# Patient Record
Sex: Male | Born: 2000 | Race: Black or African American | Hispanic: No | Marital: Single | Smoking: Never smoker
Health system: Southern US, Community
[De-identification: ages and names within clinical notes are randomized; demographics above are authoritative.]

## PROBLEM LIST (undated history)

## (undated) DIAGNOSIS — F802 Mixed receptive-expressive language disorder: Secondary | ICD-10-CM

## (undated) DIAGNOSIS — F909 Attention-deficit hyperactivity disorder, unspecified type: Secondary | ICD-10-CM

## (undated) DIAGNOSIS — Q231 Congenital insufficiency of aortic valve: Secondary | ICD-10-CM

## (undated) DIAGNOSIS — F819 Developmental disorder of scholastic skills, unspecified: Secondary | ICD-10-CM

## (undated) HISTORY — DX: Congenital insufficiency of aortic valve: Q23.1

## (undated) HISTORY — DX: Mixed receptive-expressive language disorder: F80.2

## (undated) HISTORY — DX: Developmental disorder of scholastic skills, unspecified: F81.9

---
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
## 2022-05-02 ENCOUNTER — Ambulatory Visit
Admission: EM | Admit: 2022-05-02 | Discharge: 2022-05-02 | Disposition: A | Discharge status: Home / Self Care | Payer: Medicaid Other | Attending: Physician Assistant | Admitting: Physician Assistant

## 2022-05-02 DIAGNOSIS — R109 Unspecified abdominal pain: Secondary | ICD-10-CM | POA: Diagnosis not present

## 2022-05-02 DIAGNOSIS — R1011 Right upper quadrant pain: Secondary | ICD-10-CM

## 2022-05-02 DIAGNOSIS — R319 Hematuria, unspecified: Secondary | ICD-10-CM | POA: Diagnosis not present

## 2022-05-02 DIAGNOSIS — R300 Dysuria: Secondary | ICD-10-CM | POA: Diagnosis not present

## 2022-05-02 LAB — POCT URINALYSIS DIP (MANUAL ENTRY)
Bilirubin, UA: NEGATIVE
Blood, UA: NEGATIVE
Glucose, UA: NEGATIVE mg/dL
Ketones, POC UA: NEGATIVE mg/dL
Leukocytes, UA: NEGATIVE
Nitrite, UA: NEGATIVE
Protein Ur, POC: NEGATIVE mg/dL
Spec Grav, UA: >=1.03 — AB (ref 1.010–1.025)
Urobilinogen, UA: 0.2 E.U./dL
pH, UA: 7 (ref 5.0–8.0)

## 2022-05-02 NOTE — ED Provider Notes (Signed)
[name]    CSN: [id number] Arrival date & time: 05/02/22  1217      History   Chief Complaint Chief Complaint  Patient presents with   Dysuria   Flank Pain    HPI Steve Holt is a 22 y.o. male.   72-year-old male presents with abdominal pain.  Patient indicates for the past 3 days he has been having abdominal pain which is mainly been located in the right upper quadrant and right flank.  Patient indicates that it is intermittent but persistent.  Patient indicates he has not traumatized or been hit in the back.  He indicates also that he noticed having dark yellow urine on a repeated basis along with intermittent dysuria.  He indicates that he ran into a pole 2 days ago hurting the left testicle and then afterwards he noticed that he had some blood in the urine.  Patient indicates he is not having any fever, chills, nausea or vomiting.  Patient indicates he has not injured his back, but he does do lifting at his job.  He indicates making considerable amount of soft drinks throughout the day.   Dysuria Presenting symptoms: dysuria   Associated symptoms: abdominal pain (Right upper quadrant) and flank pain   Flank Pain Associated symptoms include abdominal pain (Right upper quadrant).    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 No pertinent  surgical history.     Home Medications    Prior to Admission medications   Medication Sig Start Date End Date Taking? Authorizing Provider  cetirizine (ZYRTEC) 10 MG tablet Take 1 tablet (10 mg total) by mouth daily. 11/24/19   Roselind Messier, MD  lisdexamfetamine (VYVANSE) 60 MG capsule Take 1 capsule (60 mg total) by mouth every morning. Patient not taking: Reported on 06/09/2019 06/30/18   Gwynne Edinger, MD  PROAIR HFA 108 [phone] Base) MCG/ACT inhaler TAKE 2 PUFFS BY MOUTH EVERY 6 HOURS AS NEEDED FOR WHEEZE OR SHORTNESS OF BREATH 11/24/19   Roselind Messier, MD  Vitamin D, Ergocalciferol, (DRISDOL) 1.25 MG (50000 UNIT) CAPS capsule Take 1 capsule (50,000 Units total) by mouth every 7 (seven) days. 11/24/19   Roselind Messier, MD    Family History Family History  Problem Relation Age of Onset   Asthma Mother    Asthma Brother    Coronary artery disease Paternal Uncle    Heart attack Paternal Uncle        2 paternal uncles had MI in early 30s   Diabetes Father     Social History Social History   Tobacco Use   Smoking status: Never   Smokeless tobacco: Never  Vaping Use   Vaping Use: Every day   Substances: Nicotine, Flavoring  Substance Use Topics   Alcohol use: Never   Drug use: Yes    Types: Marijuana    Comment: occ     Allergies   Patient has no known allergies.  Review of Systems Review of Systems  Gastrointestinal:  Positive for abdominal pain (Right upper quadrant).  Genitourinary:  Positive for dysuria and flank pain.     Physical Exam Triage Vital Signs ED Triage Vitals  Enc Vitals Group     BP 05/02/22 1341 (!) 144/80     Pulse Rate 05/02/22 1341 (!) 50     Resp 05/02/22 1341 16     Temp 05/02/22 1341 98.2 F (36.8 C)     Temp src --      SpO2 05/02/22 1341 98 %     Weight --      Height --      Head Circumference --      Peak Flow --      Pain Score 05/02/22 1340 6     Pain Loc --      Pain Edu? --      Excl. in [place]? --    No data  found.  Updated Vital Signs BP (!) 144/80   Pulse (!) 50   Temp 98.2 F (36.8 C)   Resp 16   SpO2 98%   Visual Acuity Right Eye Distance:   Left Eye Distance:   Bilateral Distance:    Right Eye Near:   Left Eye Near:    Bilateral Near:     Physical Exam Constitutional:      Appearance: Normal appearance.  Cardiovascular:     Rate and Rhythm: Normal rate and regular rhythm.     Heart sounds: Normal heart sounds.  Abdominal:     General: Abdomen is flat. Bowel sounds are normal.     Palpations: Abdomen is soft.     Tenderness: There is abdominal tenderness in the right upper quadrant. There is right CVA tenderness. There is no guarding or rebound.  Neurological:     Mental Status: He is alert.      UC Treatments / Results  Labs (all labs ordered are listed, but only abnormal results are displayed) Labs Reviewed  POCT URINALYSIS DIP (MANUAL ENTRY) - Abnormal; Notable for the following components:      Result Value   Spec Grav, UA >=1.030 (*)    All other components within normal limits  CBC  COMPREHENSIVE METABOLIC PANEL  TSH    EKG   Radiology No results found.  Procedures Procedures (including critical care time)  Medications Ordered in UC Medications - No data to display  Initial Impression / Assessment and Plan / UC Course  I have reviewed the triage vital signs and the nursing notes.  Pertinent labs & imaging results that were available during my care of the patient were reviewed by me and considered in my medical decision making (see chart for details).    Plan: The diagnosis will be treated with the following: 1.  Acute right flank pain: A.  CBC, CMP, TSH lab results are pending. 2.  Dysuria: A.  Advised to stop drinking soft drinks and more water based fluids. 3.  Hematuria: A.  Advised to stop drinking soft drinks and drink more water based fluids. 4.  Abdominal pain right upper quadrant: A.  CBC, CMP, TSH lab results are pending. 5.   Advised follow-up PCP return to urgent care as needed. Final Clinical Impressions(s) / UC Diagnoses   Final diagnoses:  Acute right flank pain  Dysuria  Hematuria, unspecified type  Abdominal pain, right upper quadrant     Discharge Instructions      Advised to avoid soft drinks  and drink more water products.  Labs will be completed in 48 hours, if you do not get a call from this office that indicates labs are negative.  Log onto MyChart to be the test results when they post in 48 hours.  Advised to follow-up PCP return to urgent care as needed.    ED Prescriptions   None    PDMP not reviewed this encounter.   Nyoka Lint, PA-C 05/02/22 1414

## 2022-05-02 NOTE — Discharge Instructions (Signed)
Advised to avoid soft drinks and drink more water products.  Labs will be completed in 48 hours, if you do not get a call from this office that indicates labs are negative.  Log onto MyChart to be the test results when they post in 48 hours.  Advised to follow-up PCP return to urgent care as needed.

## 2022-05-02 NOTE — ED Triage Notes (Signed)
Pt presents to uc with co of dysuria and right sided flank pain that radiates to the rlq. Pt reports pain is 9/10 and comes and goes. Pt reports symptoms for 3 days. Pt reports hematuria and dark urine 2 days ago but it has since gone back to normal. Pt reports no otc medications

## 2022-05-03 LAB — CBC
Hematocrit: 44.5 % (ref 37.5–51.0)
Hemoglobin: 13.8 g/dL (ref 13.0–17.7)
MCH: 24.3 pg — ABNORMAL LOW (ref 26.6–33.0)
MCHC: 31 g/dL — ABNORMAL LOW (ref 31.5–35.7)
MCV: 79 fL (ref 79–97)
Platelets: 295 10*3/uL (ref 150–450)
RBC: 5.67 x10E6/uL (ref 4.14–5.80)
RDW: 13.1 % (ref 11.6–15.4)
WBC: 6.7 10*3/uL (ref 3.4–10.8)

## 2022-05-03 LAB — COMPREHENSIVE METABOLIC PANEL
ALT: 19 IU/L (ref 0–44)
AST: 23 IU/L (ref 0–40)
Albumin/Globulin Ratio: 1.5 (ref 1.2–2.2)
Albumin: 4.7 g/dL (ref 4.3–5.2)
Alkaline Phosphatase: 66 IU/L (ref 51–125)
BUN/Creatinine Ratio: 11 (ref 9–20)
BUN: 10 mg/dL (ref 6–20)
Bilirubin Total: 0.2 mg/dL (ref 0.0–1.2)
CO2: 24 mmol/L (ref 20–29)
Calcium: 10.2 mg/dL (ref 8.7–10.2)
Chloride: 101 mmol/L (ref 96–106)
Creatinine, Ser: 0.91 mg/dL (ref 0.76–1.27)
Globulin, Total: 3.1 g/dL (ref 1.5–4.5)
Glucose: 92 mg/dL (ref 70–99)
Potassium: 4.8 mmol/L (ref 3.5–5.2)
Sodium: 140 mmol/L (ref 134–144)
Total Protein: 7.8 g/dL (ref 6.0–8.5)
eGFR: 125 mL/min/{1.73_m2} (ref >59)

## 2022-05-03 LAB — TSH: TSH: 2.29 u[IU]/mL (ref 0.450–4.500)

## 2022-08-10 ENCOUNTER — Ambulatory Visit
Admission: EM | Admit: 2022-08-10 | Discharge: 2022-08-10 | Disposition: A | Discharge status: Home / Self Care | Payer: Medicaid Other | Attending: Internal Medicine | Admitting: Internal Medicine

## 2022-08-10 DIAGNOSIS — J029 Acute pharyngitis, unspecified: Secondary | ICD-10-CM | POA: Diagnosis present

## 2022-08-10 LAB — POCT MONO SCREEN (KUC): Mono, POC: NEGATIVE

## 2022-08-10 LAB — POCT RAPID STREP A (OFFICE): Rapid Strep A Screen: NEGATIVE

## 2022-08-10 MED ORDER — CHLORASEPTIC 1.4 % MT LIQD: 1.0000 | OROMUCOSAL | 0 refills | Status: AC | PRN

## 2022-08-10 NOTE — ED Triage Notes (Signed)
Pt states that he has a sore throat x1 day

## 2022-08-10 NOTE — Discharge Instructions (Signed)
Rapid strep and rapid mono were negative.  I have prescribed you a throat spray to help alleviate discomfort.  Follow-up if any symptoms persist or worsen.

## 2022-08-10 NOTE — ED Provider Notes (Signed)
[name]    CSN: [id number] Arrival date & time: 08/10/22  1018      History   Chief Complaint Chief Complaint  Patient presents with   Sore Throat    X1 day Sore throat    HPI Steve Holt is a 22 y.o. male.   Patient presents with sore throat that started this morning.  Denies any other symptoms or fever.  Denies any known sick contacts.  Patient has not taken any medication for pain.   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 No pertinent surgical history.     Home Medications    Prior to Admission medications   Medication Sig Start Date End Date Taking? Authorizing Provider  phenol (CHLORASEPTIC) 1.4 % LIQD Use as directed 1 spray in the mouth or throat as needed for throat irritation / pain. 08/10/22  Yes Diaz Crago, Acie Fredrickson, FNP  cetirizine (ZYRTEC) 10 MG tablet Take 1 tablet (10 mg total) by mouth daily. 11/24/19   Theadore Nan, MD  lisdexamfetamine (VYVANSE) 60 MG capsule Take 1 capsule (60 mg total) by mouth every morning. Patient not taking: Reported on 06/09/2019 06/30/18   Leatha Gilding, MD  PROAIR HFA 108 [phone] Base) MCG/ACT inhaler TAKE 2 PUFFS BY MOUTH EVERY 6 HOURS AS NEEDED FOR WHEEZE OR SHORTNESS OF BREATH 11/24/19   Theadore Nan, MD  Vitamin D, Ergocalciferol, (DRISDOL) 1.25 MG (50000 UNIT)  CAPS capsule Take 1 capsule (50,000 Units total) by mouth every 7 (seven) days. 11/24/19   Theadore Nan, MD    Family History Family History  Problem Relation Age of Onset   Asthma Mother    Asthma Brother    Coronary artery disease Paternal Uncle    Heart attack Paternal Uncle        2 paternal uncles had MI in early 21s   Diabetes Father     Social History Social History   Tobacco Use   Smoking status: Never   Smokeless tobacco: Never  Vaping Use   Vaping Use: Never used  Substance Use Topics   Alcohol use: Never   Drug use: Yes    Types: Marijuana    Comment: occ     Allergies   Patient has no known allergies.   Review of Systems Review of Systems Per HPI  Physical Exam Triage Vital Signs ED Triage Vitals [08/10/22 1051]  Enc Vitals Group     BP 117/74     Pulse Rate (!) 57     Resp (!) 107     Temp 98.5 F (36.9 C)     Temp Source Oral     SpO2 99 %     Weight 250 lb (113.4 kg)     Height 6\' 4"  (1.93 m)     Head Circumference      Peak Flow      Pain Score 8  Pain Loc      Pain Edu?      Excl. in [place]?    No data found.  Updated Vital Signs BP 117/74 (BP Location: Left Arm)   Pulse (!) 57   Temp 98.5 F (36.9 C) (Oral)   Resp 17   Ht 6\' 4"  (1.93 m)   Wt 250 lb (113.4 kg)   SpO2 99%   BMI 30.43 kg/m   Visual Acuity Right Eye Distance:   Left Eye Distance:   Bilateral Distance:    Right Eye Near:   Left Eye Near:    Bilateral Near:     Physical Exam Constitutional:      General: He is not in acute distress.    Appearance: Normal appearance. He is not toxic-appearing or diaphoretic.  HENT:     Head: Normocephalic and atraumatic.     Right Ear: Tympanic membrane and ear canal normal.     Left Ear: Tympanic membrane and ear canal normal.     Nose: No congestion.     Mouth/Throat:     Mouth: Mucous membranes are moist.     Pharynx: Posterior oropharyngeal erythema present.  Eyes:     Extraocular Movements: Extraocular  movements intact.     Conjunctiva/sclera: Conjunctivae normal.     Pupils: Pupils are equal, round, and reactive to light.  Cardiovascular:     Rate and Rhythm: Normal rate and regular rhythm.     Pulses: Normal pulses.     Heart sounds: Normal heart sounds.  Pulmonary:     Effort: Pulmonary effort is normal. No respiratory distress.     Breath sounds: Normal breath sounds. No stridor. No wheezing, rhonchi or rales.  Abdominal:     General: Abdomen is flat. Bowel sounds are normal.     Palpations: Abdomen is soft.  Musculoskeletal:        General: Normal range of motion.     Cervical back: Normal range of motion.  Skin:    General: Skin is warm and dry.  Neurological:     General: No focal deficit present.     Mental Status: He is alert and oriented to person, place, and time. Mental status is at baseline.  Psychiatric:        Mood and Affect: Mood normal.        Behavior: Behavior normal.      UC Treatments / Results  Labs (all labs ordered are listed, but only abnormal results are displayed) Labs Reviewed  CULTURE, GROUP A STREP [facility])  POCT RAPID STREP A (OFFICE)  POCT MONO SCREEN ([facility])    EKG   Radiology No results found.  Procedures Procedures (including critical care time)  Medications Ordered in UC Medications - No data to display  Initial Impression / Assessment and Plan / UC Course  I have reviewed the triage vital signs and the nursing notes.  Pertinent labs & imaging results that were available during my care of the patient were reviewed by me and considered in my medical decision making (see chart for details).     Differential diagnoses include viral illness versus allergy related symptoms.  Rapid strep and mono mono were negative.  Throat culture pending.  Patient declined COVID testing.  Chloraseptic spray prescribed to help alleviate symptoms.  Advised supportive care and management.  Advised strict follow-up precautions.  Patient verbalized  understanding and was agreeable with plan. Final Clinical Impressions(s) / UC Diagnoses   Final diagnoses:  Acute pharyngitis, unspecified etiology  Discharge Instructions      Rapid strep and rapid mono were negative.  I have prescribed you a throat spray to help alleviate discomfort.  Follow-up if any symptoms persist or worsen.     ED Prescriptions     Medication Sig Dispense Auth. Provider   phenol (CHLORASEPTIC) 1.4 % LIQD Use as directed 1 spray in the mouth or throat as needed for throat irritation / pain. 118 mL Gustavus Bryant, [place]      PDMP not reviewed this encounter.   Gustavus Bryant, [place] 08/10/22 1134

## 2022-08-11 LAB — CULTURE, GROUP A STREP (THRC)

## 2023-01-18 IMAGING — DX DG ANKLE COMPLETE 3+V*L*
3 series · 3 of 3 positions shown · non-contrast
Comparison: None.

CLINICAL DATA: Injury, fall.

EXAM:
LEFT ANKLE COMPLETE - 3+ VIEW

[ankle ap]
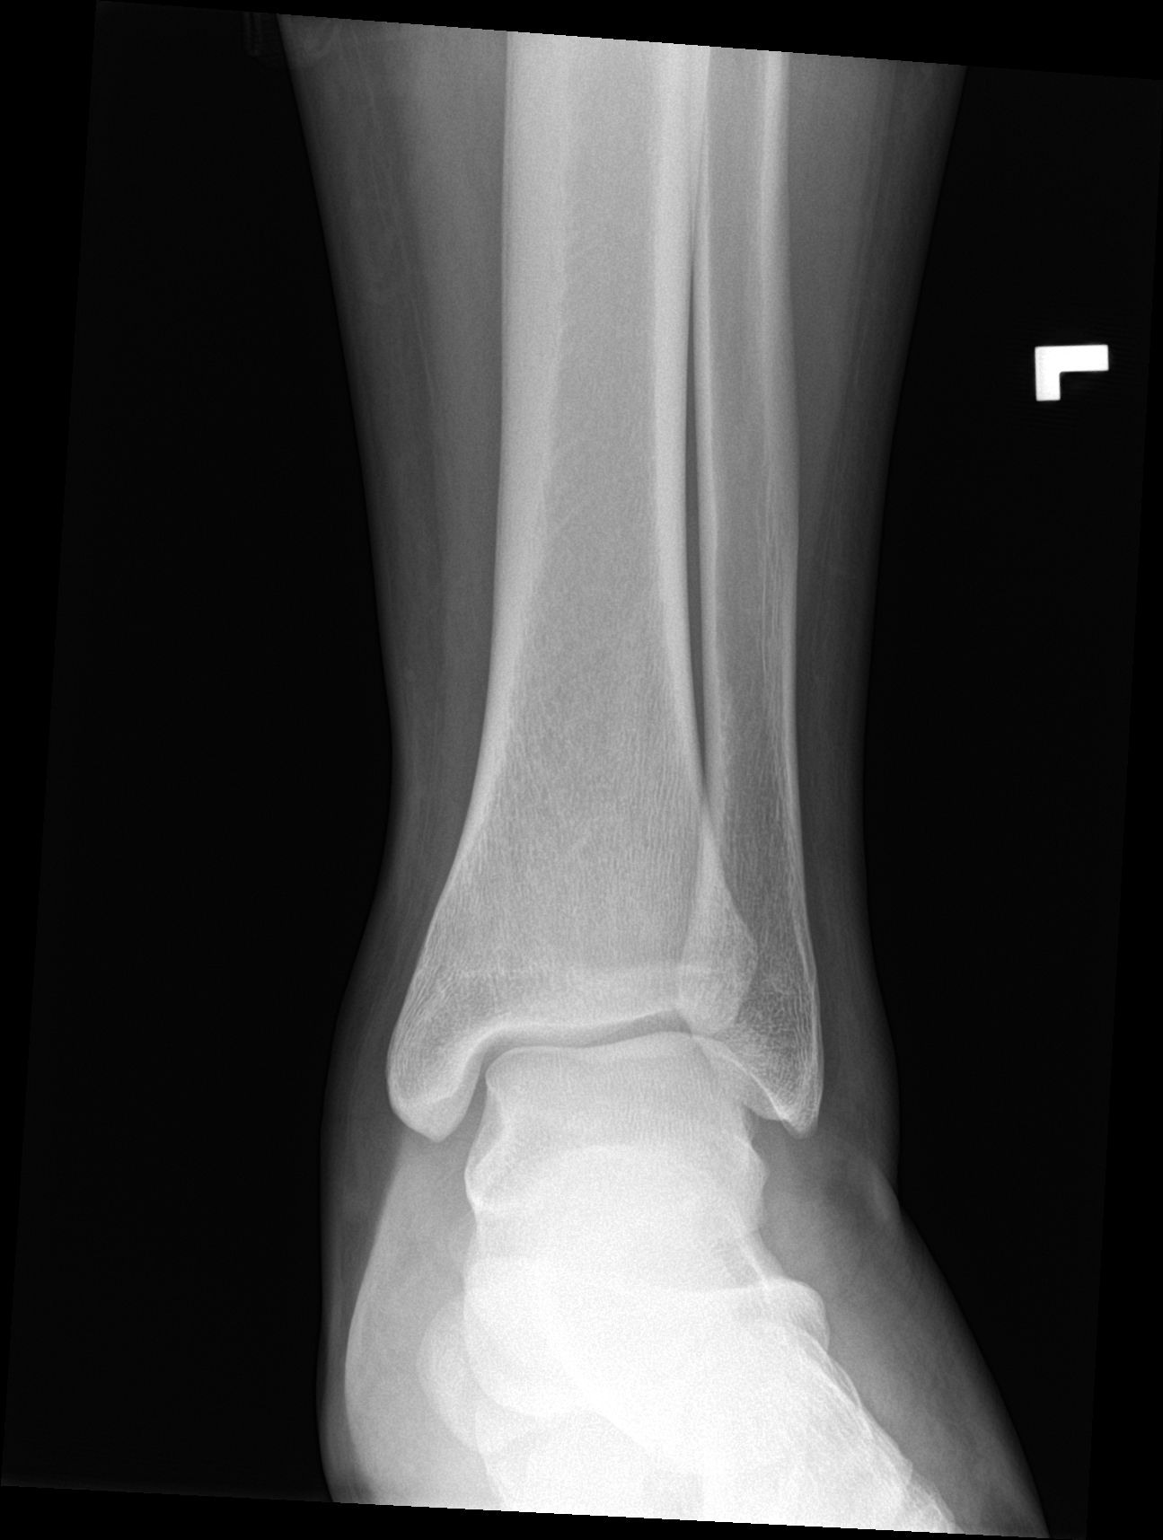

[ankle obl]
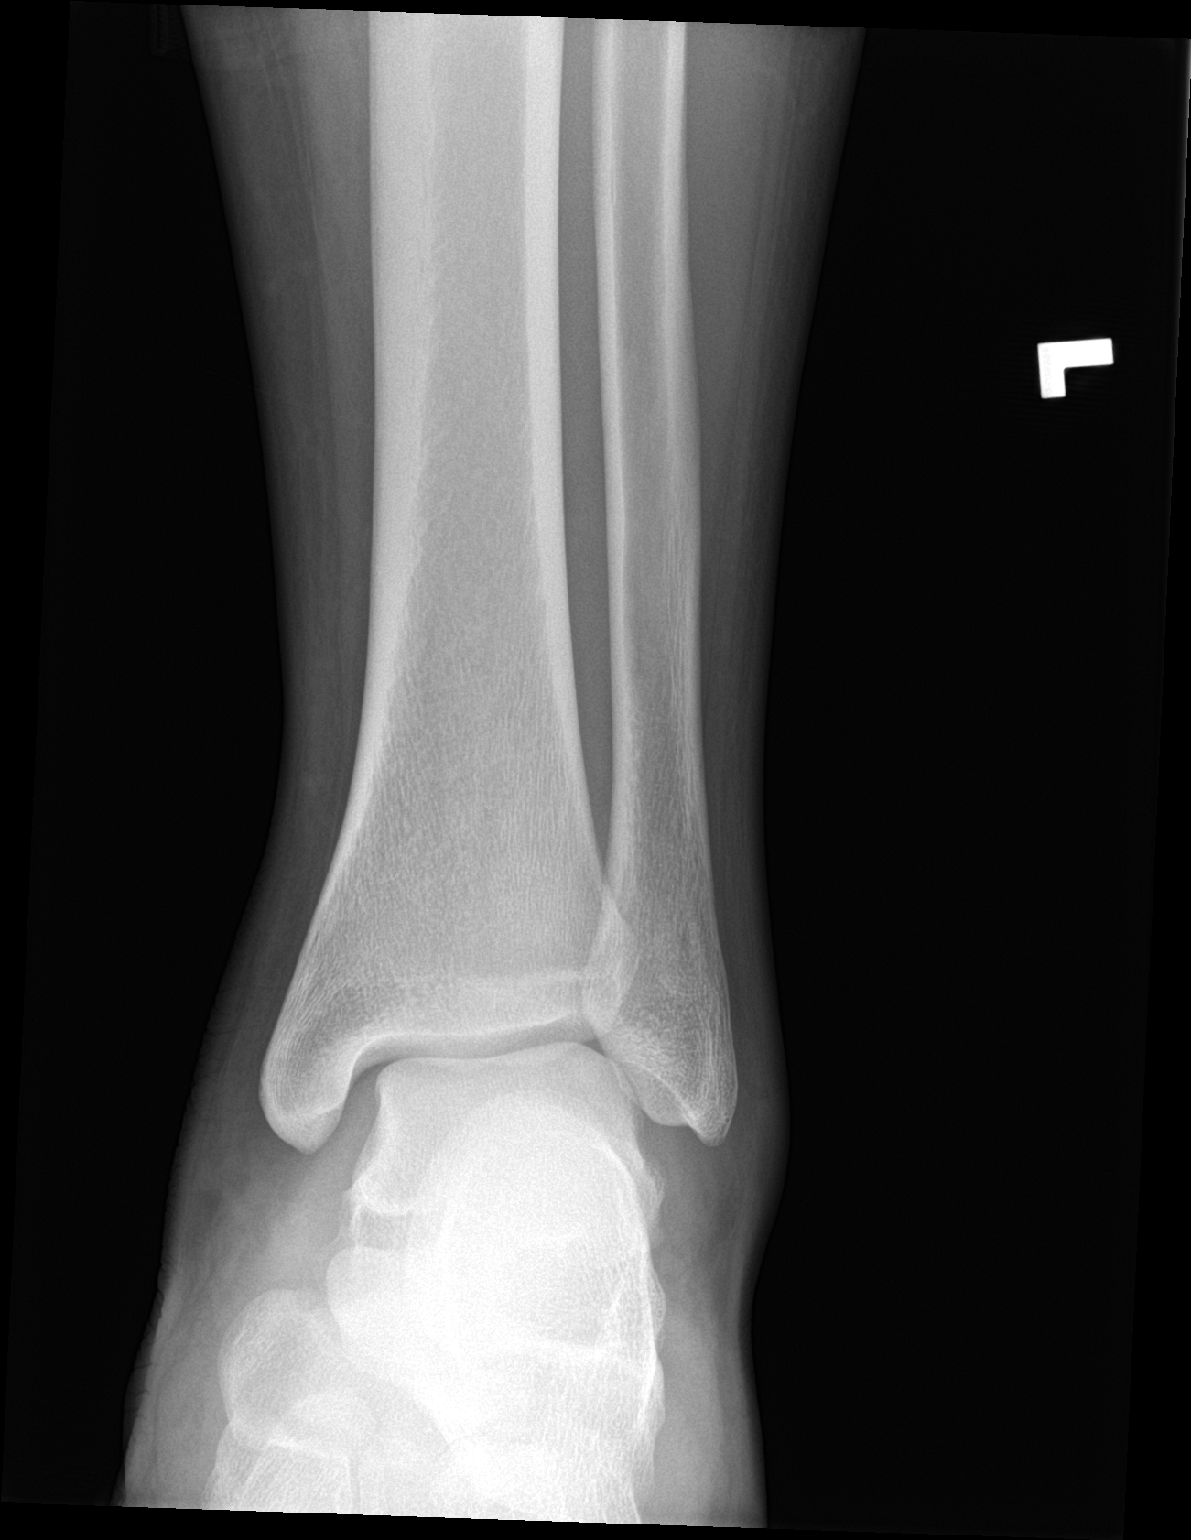

[ankle lat]
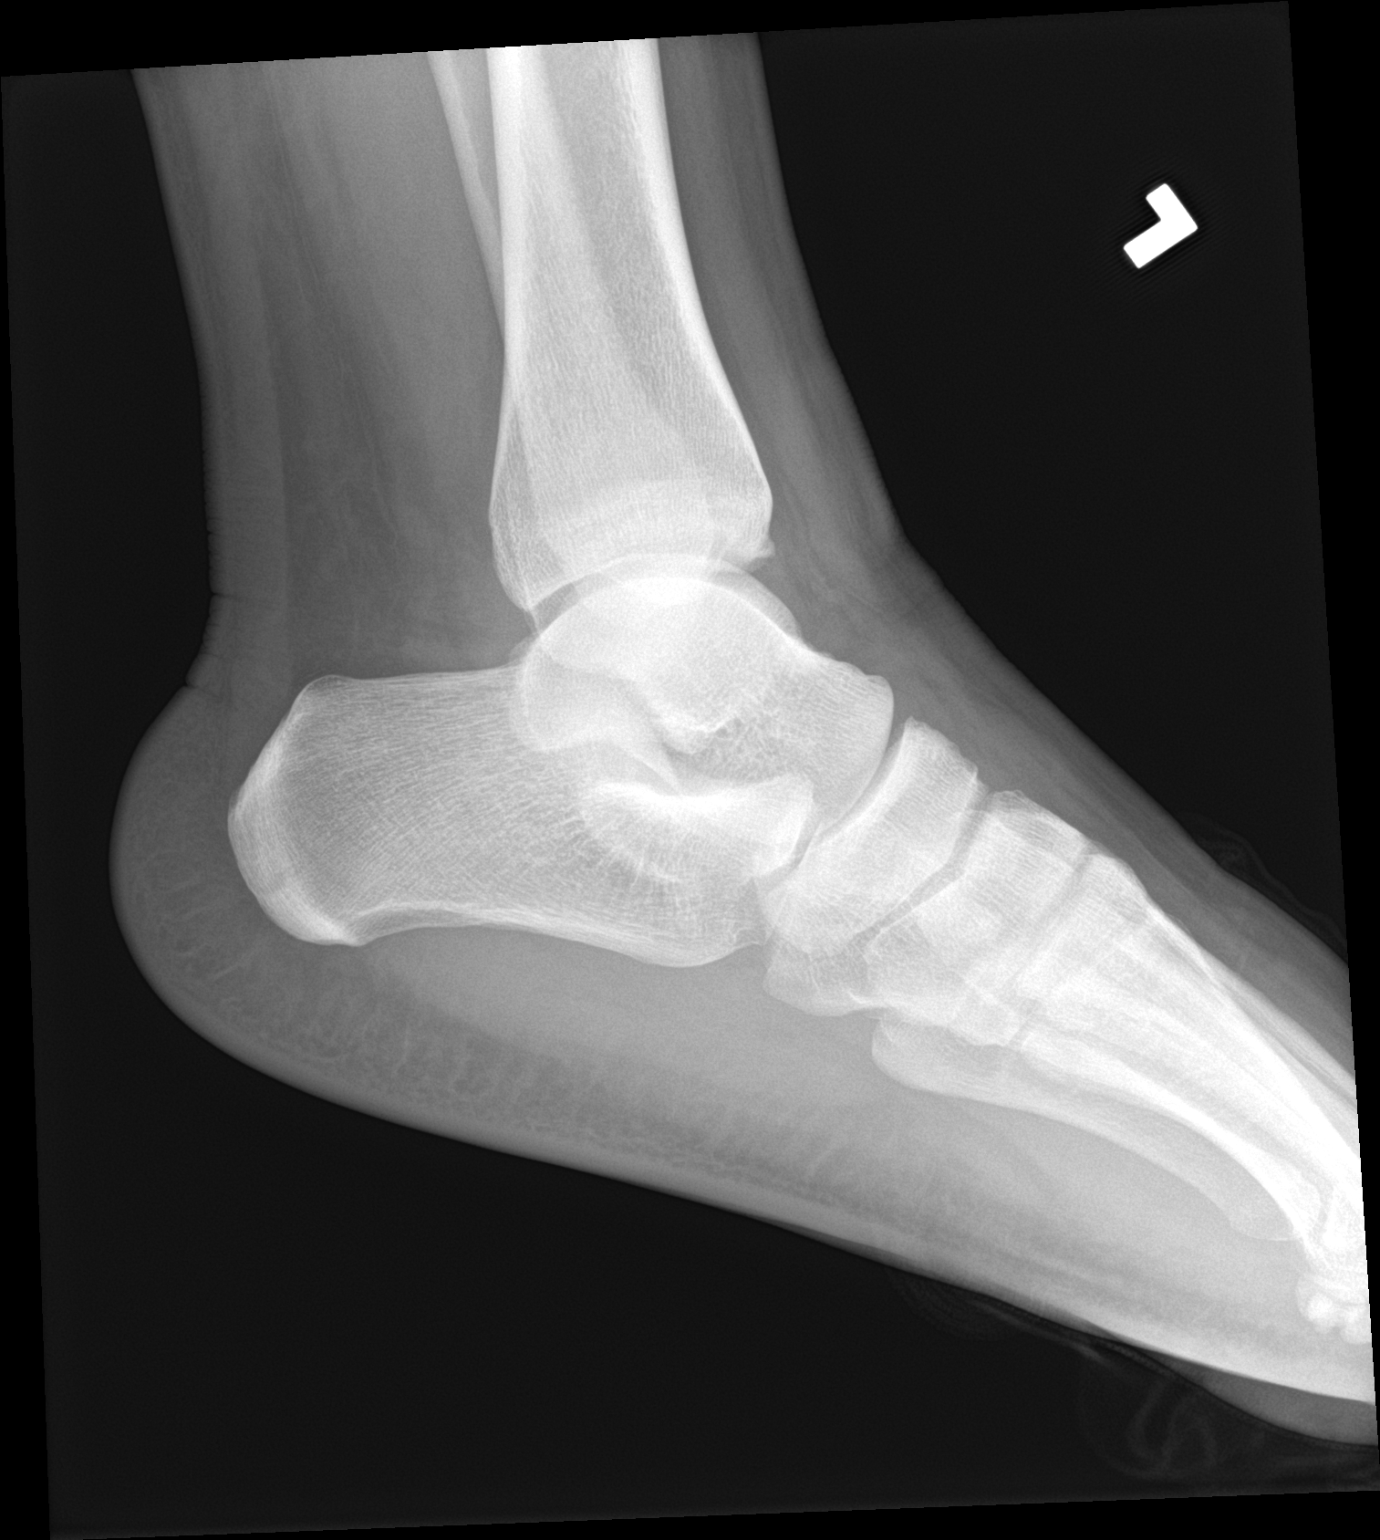

[3 of 3 positions shown; findings below may reference images not displayed]

FINDINGS: There is no evidence of fracture, dislocation, or joint effusion.
There is no evidence of arthropathy or other focal bone abnormality.
There is soft tissue swelling surrounding the ankle.
IMPRESSION: No acute fracture or dislocation.
